# Patient Record
Sex: Female | Born: 1970 | Race: Black or African American | Hispanic: No | Marital: Single | Smoking: Current every day smoker
Health system: Southern US, Community
[De-identification: ages and names within clinical notes are randomized; demographics above are authoritative.]

## PROBLEM LIST (undated history)

## (undated) DIAGNOSIS — K219 Gastro-esophageal reflux disease without esophagitis: Secondary | ICD-10-CM

## (undated) HISTORY — PX: TUBAL LIGATION: SHX77

## (undated) HISTORY — PX: HIP SURGERY: SHX245

---

## 2006-02-16 ENCOUNTER — Emergency Department: Payer: Self-pay | Admitting: Internal Medicine

## 2007-01-19 ENCOUNTER — Emergency Department: Payer: Self-pay

## 2009-01-29 ENCOUNTER — Emergency Department: Payer: Self-pay | Admitting: Emergency Medicine

## 2010-03-05 ENCOUNTER — Emergency Department: Payer: Self-pay | Admitting: Unknown Physician Specialty

## 2010-09-18 IMAGING — CR RIGHT ANKLE - 2 VIEW
1 series · 2 of 2 positions shown · non-contrast
Comparison: none

REASON FOR EXAM: pain swelling
COMMENTS:

PROCEDURE:     DXR - DXR ANKLE RIGHT AP AND LATERAL  - March 05, 2010  [DATE]
RESULT:     No fracture, dislocation or other acute bony abnormality is
identified. The ankle mortise is well maintained. Incidental note is made of
plantar and Achilles calcaneal spurs.

[Series 1: view not recorded · 0.17mm/px · 2 of 2 slices shown]
[im 1/2]
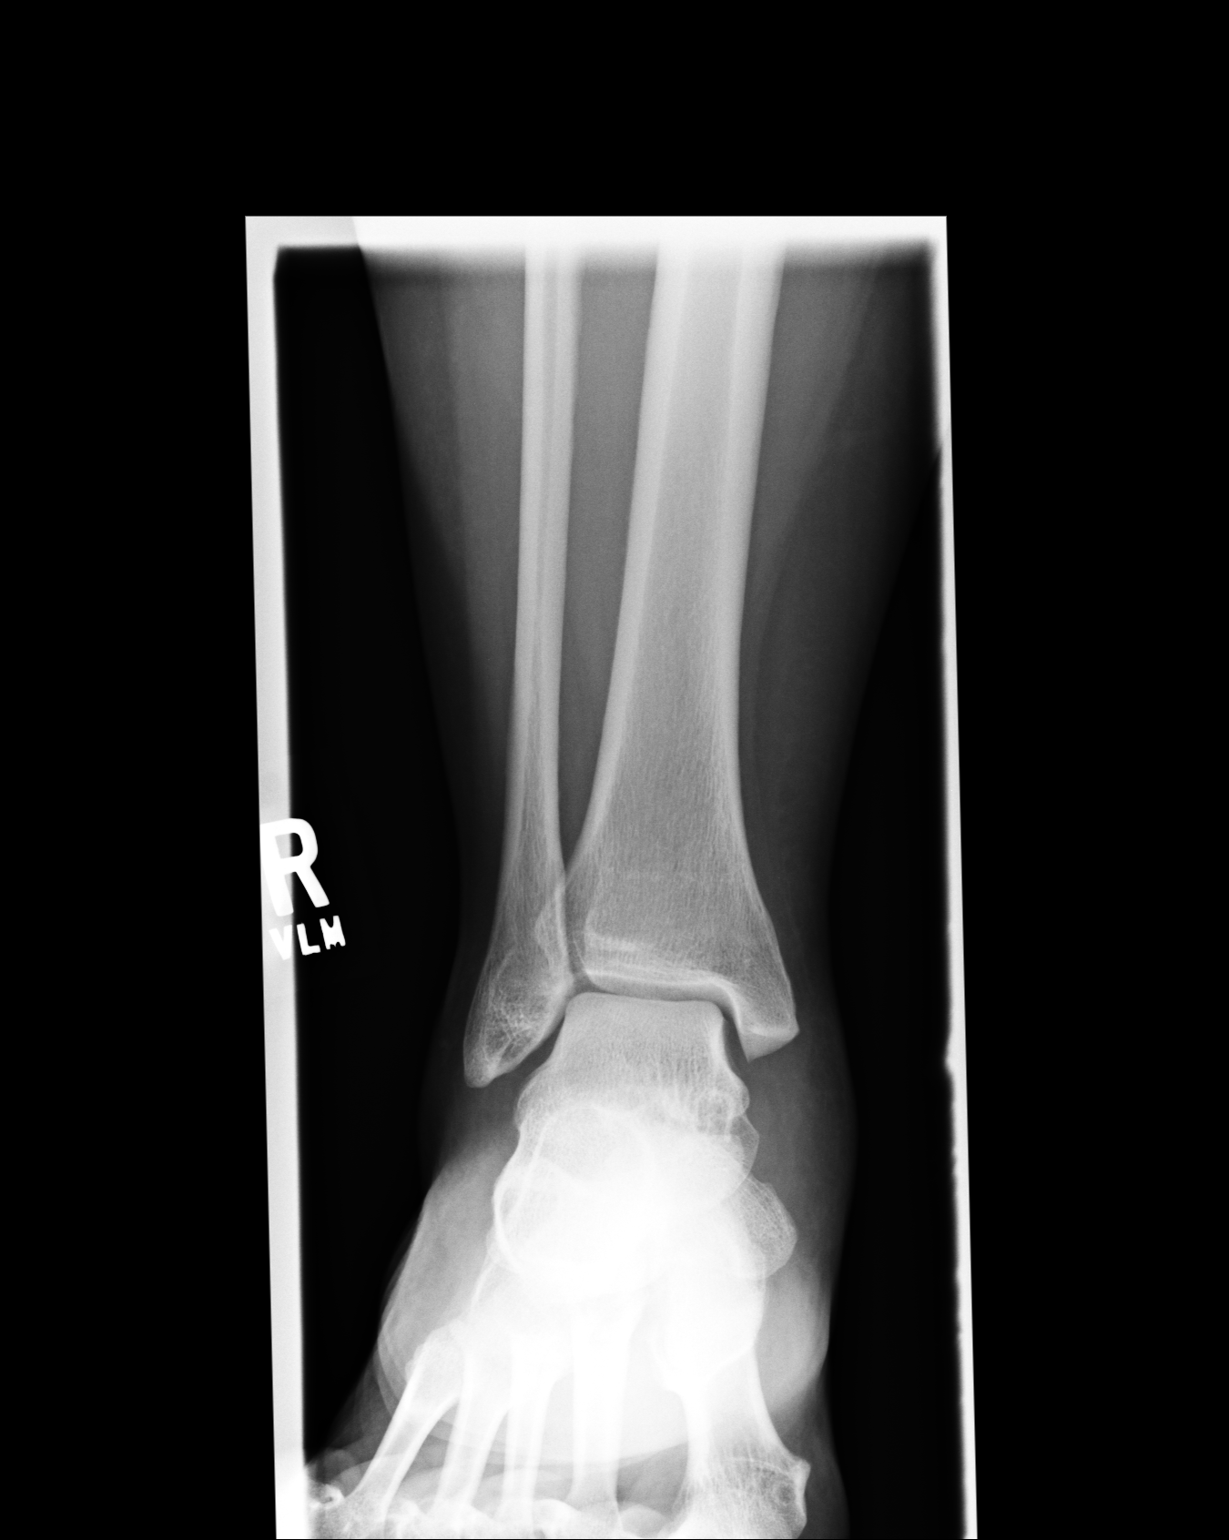
[im 2/2]
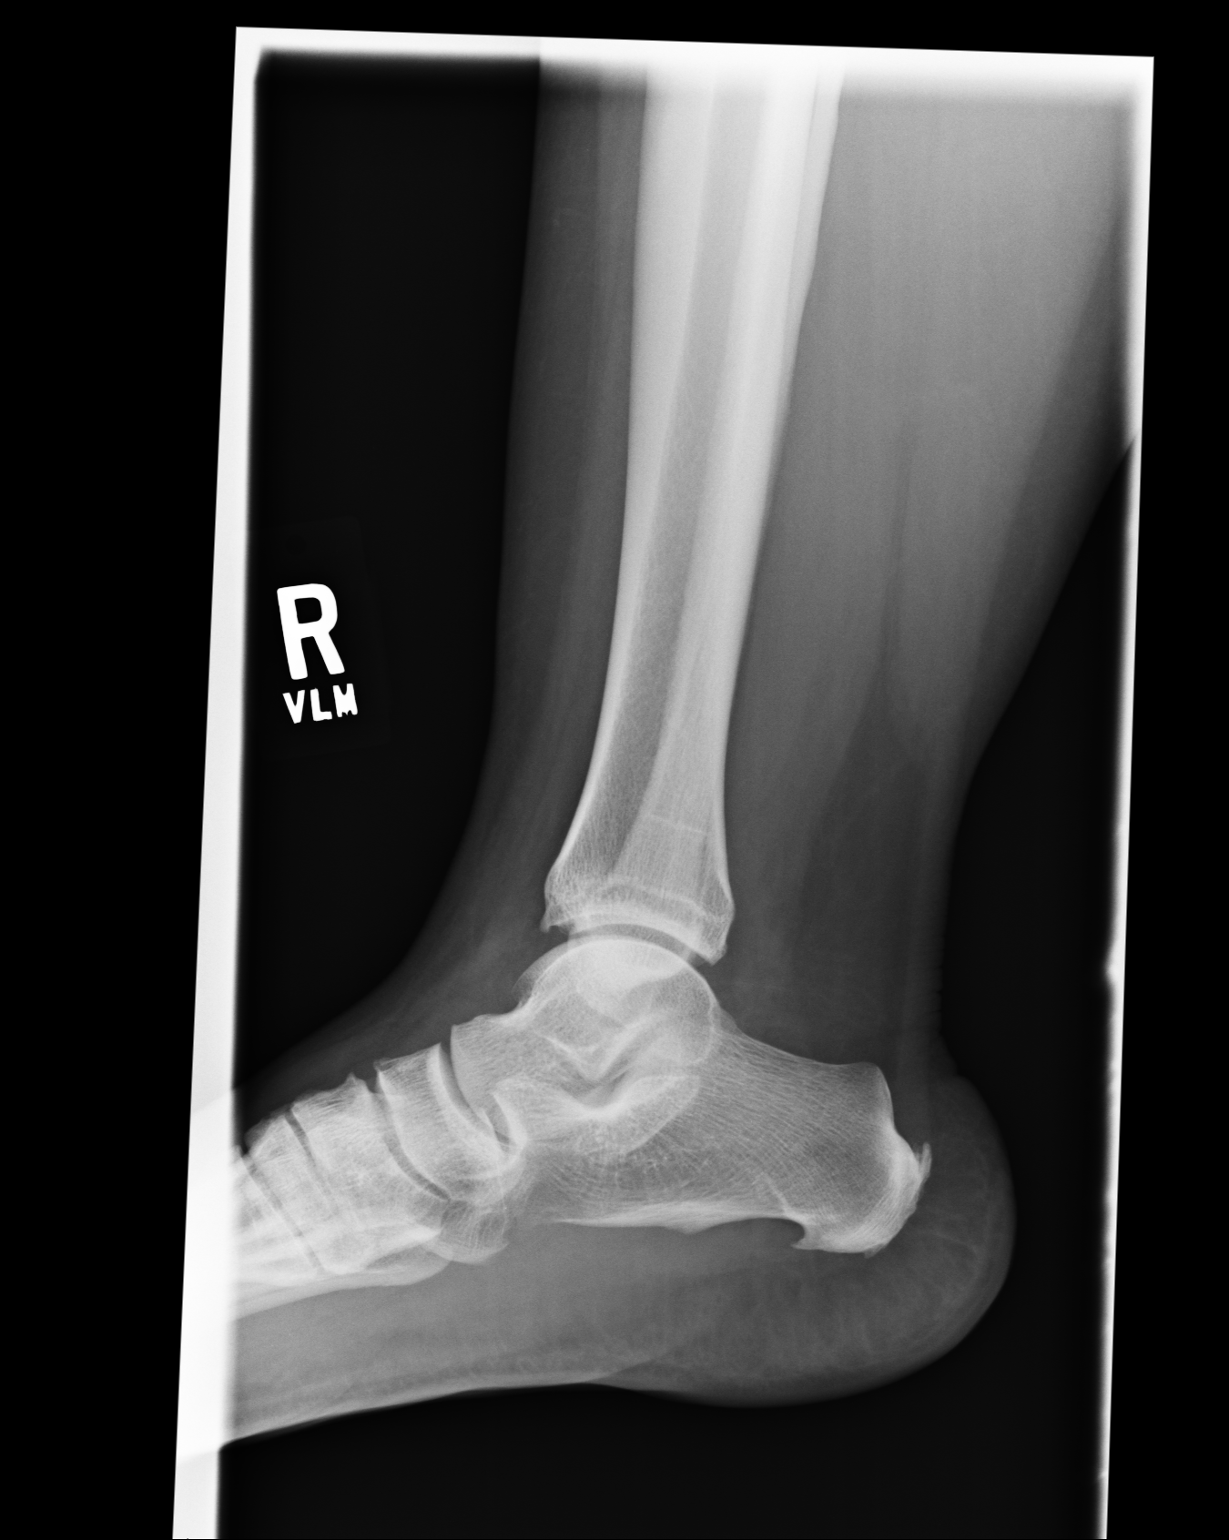

[2 of 2 positions shown; findings below may reference images not displayed]

IMPRESSION: 1. No acute bony abnormalities are seen.
2. Plantar and Achilles calcaneal spurs are noted.

## 2016-03-20 ENCOUNTER — Encounter: Payer: Self-pay | Admitting: Emergency Medicine

## 2016-03-20 ENCOUNTER — Emergency Department
Admission: EM | Admit: 2016-03-20 | Discharge: 2016-03-20 | Disposition: A | Discharge status: Home / Self Care | Payer: Managed Care, Other (non HMO) | Attending: Emergency Medicine | Admitting: Emergency Medicine

## 2016-03-20 DIAGNOSIS — F17210 Nicotine dependence, cigarettes, uncomplicated: Secondary | ICD-10-CM | POA: Diagnosis not present

## 2016-03-20 DIAGNOSIS — M25551 Pain in right hip: Secondary | ICD-10-CM | POA: Diagnosis present

## 2016-03-20 DIAGNOSIS — M7071 Other bursitis of hip, right hip: Secondary | ICD-10-CM

## 2016-03-20 DIAGNOSIS — Y939 Activity, unspecified: Secondary | ICD-10-CM | POA: Diagnosis not present

## 2016-03-20 HISTORY — DX: Gastro-esophageal reflux disease without esophagitis: K21.9

## 2016-03-20 MED ORDER — NAPROXEN 500 MG PO TABS: 500.0000 mg | ORAL_TABLET | Freq: Two times a day (BID) | ORAL | Status: DC

## 2016-03-20 NOTE — Discharge Instructions (Signed)
Heat Therapy Heat therapy can help ease sore, stiff, injured, and tight muscles and joints. Heat relaxes your muscles, which may help ease your pain. Heat therapy should only be used on old, pre-existing, or long-lasting (chronic) injuries. Do not use heat therapy unless told by your doctor. HOW TO USE HEAT THERAPY There are several different kinds of heat therapy, including:  Moist heat pack.  Warm water bath.  Hot water bottle.  Electric heating pad.  Heated gel pack.  Heated wrap.  Electric heating pad. GENERAL HEAT THERAPY RECOMMENDATIONS   Do not sleep while using heat therapy. Only use heat therapy while you are awake.  Your skin may turn pink while using heat therapy. Do not use heat therapy if your skin turns red.  Do not use heat therapy if you have new pain.  High heat or long exposure to heat can cause burns. Be careful when using heat therapy to avoid burning your skin.  Do not use heat therapy on areas of your skin that are already irritated, such as with a rash or sunburn. GET HELP IF:   You have blisters, redness, swelling (puffiness), or numbness.  You have new pain.  Your pain is worse. MAKE SURE YOU:  Understand these instructions.  Will watch your condition.  Will get help right away if you are not doing well or get worse.   This information is not intended to replace advice given to you by your health care provider. Make sure you discuss any questions you have with your health care provider.   Document Released: 02/06/2012 Document Revised: 12/05/2014 Document Reviewed: 01/07/2014 Elsevier Interactive Patient Education 2016 Elsevier Inc.  Hip Bursitis Bursitis is a puffiness (swelling) and soreness of a fluid-filled sac (bursa). This sac covers and protects the joint. HOME CARE  Put ice on the injured area.  Put ice in a plastic bag.  Place a towel between your skin and the bag.  Leave the ice on for 15-20 minutes, 03-04 times a  day.  Rest the painful joint as much as possible. Move your joint at least 4 times a day. When pain lessens, start normal, slow movements and normal activities.  Only take medicine as told by your doctor.  Use crutches as told.  Raise (elevate) your painful joint. Use pillows for propping your legs and hips.  Get a massage to lessen pain. GET HELP RIGHT AWAY IF:  Your pain increases or does not improve during treatment.  You have a fever.  You feel heat coming from the affected area.  You see redness and puffiness around the affected area.  You have any questions or concerns. MAKE SURE YOU:  Understand these instructions.  Will watch your condition.  Will get help right away if you are not well or get worse.   This information is not intended to replace advice given to you by your health care provider. Make sure you discuss any questions you have with your health care provider.   Document Released: 12/17/2010 Document Revised: 02/06/2012 Document Reviewed: 06/16/2015 Elsevier Interactive Patient Education 2016 Elsevier Inc.   Cryotherapy    Cryotherapy is when you put ice on your injury. Ice helps lessen pain and puffiness (swelling) after an injury. Ice works the best when you start using it in the first 24 to 48 hours after an injury.  HOME CARE  Put a dry or damp towel between the ice pack and your skin.  You may press gently on the ice pack.  Leave the ice on for no more than 10 to 20 minutes at a time.  Check your skin after 5 minutes to make sure your skin is okay.  Rest at least 20 minutes between ice pack uses.  Stop using ice when your skin loses feeling (numbness).  Do not use ice on someone who cannot tell you when it hurts. This includes small children and people with memory problems (dementia). GET HELP RIGHT AWAY IF:  You have white spots on your skin.  Your skin turns blue or pale.  Your skin feels waxy or hard.  Your puffiness gets worse. MAKE  SURE YOU:  Understand these instructions.  Will watch your condition.  Will get help right away if you are not doing well or get worse. This information is not intended to replace advice given to you by your health care provider. Make sure you discuss any questions you have with your health care provider.  Document Released: 05/02/2008 Document Revised: 02/06/2012 Document Reviewed: 07/07/2011  Elsevier Interactive Patient Education [employer] Elsevier Inc.

## 2016-03-20 NOTE — ED Provider Notes (Signed)
[facility] Emergency Department Provider Note  ____________________________________________  Time seen: Approximately 8:54 AM  I have reviewed the triage vital signs and the nursing notes.   HISTORY  Chief Complaint Hip Pain    HPI Emily Cantrell is a 45 y.o. female , NAD, presents to the emergency department with 2 day history of lateral right hip pain. States she was [place] local [place] on [place] evening and noticed hip pain on her way home. Denies any falls, injuries or traumas to the area. Does state that she walked around the fair with her godson throughout the evening. Did ride a ride in which moved in circles but no traumatic movement on the right. Doesn't believe it was more walking than she does regularly. Has a history of left hip fracture when she was 45 years old but no issues with her right. Pain increases when she goes from a sitting to standing position. Has not noted any bruising, redness, swelling, warmth to the area. No skin sores or open wounds. Denies any numbness, weakness, tingling. No changes in urinary or bowel habits. No fever, chills, body aches. No back pain. Did take 1 dose of Advil prior to going to bed last night and is uncertain of efficacy.   Past Medical History  Diagnosis Date  . GERD (gastroesophageal reflux disease)     There are no active problems to display for this patient.   Past Surgical History  Procedure Laterality Date  . Tubal ligation    . Hip surgery      left    Current Outpatient Rx  Name  Route  Sig  Dispense  Refill  . naproxen (NAPROSYN) 500 MG tablet   Oral   Take 1 tablet (500 mg total) by mouth 2 (two) times daily with a meal.   14 tablet   0     Allergies Review of patient's allergies indicates no known allergies.  History reviewed. No pertinent family history.  Social History Social History  Substance Use Topics  . Smoking status: Current Every Day Smoker -- 0.25 packs/day    Types:  Cigarettes  . Smokeless tobacco: None  . Alcohol Use: Yes     Review of Systems  Constitutional: No fever/chills, fatigue Cardiovascular: No chest pain, palpitations. Respiratory: No cough. No shortness of breath. No wheezing.  Gastrointestinal: No abdominal pain.  No nausea, vomiting.  No diarrhea.  No constipation. Genitourinary: Negative for dysuria, hematuria. No urinary hesitancy, urgency or increased frequency. Musculoskeletal: Positive right hip pain. Negative for back pain.  Skin: Negative for rash him a bruising, skin source. Neurological: Negative for headaches, focal weakness or numbness. No saddle paresthesias. No tingling. 10-point ROS otherwise negative.  ____________________________________________   PHYSICAL EXAM:  VITAL SIGNS: ED Triage Vitals  Enc Vitals Group     BP 03/20/16 0829 123/85 mmHg     Pulse Rate 03/20/16 0829 72     Resp 03/20/16 0829 18     Temp 03/20/16 0829 98 F (36.7 C)     Temp Source 03/20/16 0829 Oral     SpO2 03/20/16 0829 100 %     Weight 03/20/16 0829 226 lb (102.513 kg)     Height 03/20/16 0829  (1.626 m)     Head Cir --      Peak Flow --      Pain Score 03/20/16 0829 7     Pain Loc --      Pain Edu? --  Excl. in GC? --      Constitutional: Alert and oriented. Well appearing and in no acute distress. Eyes: Conjunctivae are normal. Head: Atraumatic. Neck: Supple with full range of motion. Hematological/Lymphatic/Immunilogical: No cervical lymphadenopathy. Cardiovascular:   Good peripheral circulation with 2+ pulses noted in bilateral lower extremities. Respiratory: Normal respiratory effort without tachypnea or retractions.  Gastrointestinal: Soft and nontender. No distention. No CVA tenderness. Musculoskeletal: Tenderness to palpation about the right greater trochanter. Full range of motion of the right hip and lower extremity. No lower extremity tenderness nor edema.  No joint effusions. Neurologic:  Normal speech  and language. No gross focal neurologic deficits are appreciated.  Skin:  Skin is warm, dry and intact. No rash, redness, warmth, swelling noted about the right hip. Psychiatric: Mood and affect are normal. Speech and behavior are normal. Patient exhibits appropriate insight and judgement.   ____________________________________________   LABS  None ____________________________________________  EKG  None ____________________________________________  RADIOLOGY  None ____________________________________________    PROCEDURES  Procedure(s) performed: None      Medications - No data to display   ____________________________________________   INITIAL IMPRESSION / ASSESSMENT AND PLAN / ED COURSE  Patient's diagnosis is consistent with right hip bursitis. Patient will be discharged home with prescriptions for naproxen to take as directed. May apply alternating heat and ice to the right hip for symptomatic control. Advised the patient follow with her primary care provider in 2-3 days if not improving. Patient is given ED precautions to return to the ED for any worsening or new symptoms.      ____________________________________________  FINAL CLINICAL IMPRESSION(S) / ED DIAGNOSES  Final diagnoses:  Hip bursitis, right      NEW MEDICATIONS STARTED DURING THIS VISIT:  Discharge Medication List as of 03/20/2016  8:56 AM    START taking these medications   Details  naproxen (NAPROSYN) 500 MG tablet Take 1 tablet (500 mg total) by mouth 2 (two) times daily with a meal., Starting 03/20/2016, Until Discontinued, Print             Hope PigeonJami L Delorean Knutzen, PA-C 03/20/16 [id number]  Myrna Blazeravid Matthew Schaevitz, MD 03/20/16 1623
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
## 2016-03-20 NOTE — ED Notes (Signed)
NAD noted at time of D/C. Pt denies questions or concerns. Pt ambulatory to the lobby at this time.  

## 2019-04-18 ENCOUNTER — Other Ambulatory Visit: Payer: Self-pay

## 2019-04-18 ENCOUNTER — Encounter: Payer: Self-pay | Admitting: Emergency Medicine

## 2019-04-18 DIAGNOSIS — M25552 Pain in left hip: Secondary | ICD-10-CM | POA: Diagnosis not present

## 2019-04-18 DIAGNOSIS — F17210 Nicotine dependence, cigarettes, uncomplicated: Secondary | ICD-10-CM | POA: Diagnosis not present

## 2019-04-18 DIAGNOSIS — M545 Low back pain: Secondary | ICD-10-CM | POA: Diagnosis not present
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
## 2019-04-19 ENCOUNTER — Emergency Department
Admission: EM | Admit: 2019-04-19 | Discharge: 2019-04-19 | Disposition: A | Discharge status: Home / Self Care | Payer: No Typology Code available for payment source | Attending: Emergency Medicine | Admitting: Emergency Medicine

## 2019-04-19 ENCOUNTER — Emergency Department: Payer: No Typology Code available for payment source

## 2019-04-19 DIAGNOSIS — M7918 Myalgia, other site: Secondary | ICD-10-CM

## 2019-04-19 MED ORDER — IBUPROFEN 600 MG PO TABS
600.0000 mg | ORAL_TABLET | Freq: Once | ORAL | Status: AC
  Administered 2019-04-19: 600 mg via ORAL
  Filled 2019-04-19: qty 1

## 2019-04-19 NOTE — Discharge Instructions (Addendum)
Return to the emergency room for any new or worrisome symptoms including increased pain or abdominal pain, severe headaches, numbness or weakness or other issues as discussed.  I recommend taking ibuprofen as directed and tolerated for the discomfort.  You should anticipate a couple days of muscle pain from this.  If you have other concerns, please follow-up with [place] I am also giving you follow-up with orthopedic surgery, as a precaution although if your symptoms resolve you may not need to follow-up with them.
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
## 2022-11-10 ENCOUNTER — Other Ambulatory Visit: Payer: Self-pay

## 2022-11-10 ENCOUNTER — Emergency Department
Admission: EM | Admit: 2022-11-10 | Discharge: 2022-11-10 | Disposition: A | Discharge status: Home / Self Care | Payer: Managed Care, Other (non HMO) | Attending: Emergency Medicine | Admitting: Emergency Medicine

## 2022-11-10 ENCOUNTER — Emergency Department: Payer: Managed Care, Other (non HMO)

## 2022-11-10 DIAGNOSIS — M25512 Pain in left shoulder: Secondary | ICD-10-CM | POA: Diagnosis present

## 2022-11-10 DIAGNOSIS — M6520 Calcific tendinitis, unspecified site: Secondary | ICD-10-CM

## 2022-11-10 DIAGNOSIS — M65222 Calcific tendinitis, left upper arm: Secondary | ICD-10-CM | POA: Insufficient documentation

## 2022-11-10 MED ORDER — KETOROLAC TROMETHAMINE 15 MG/ML IJ SOLN
15.0000 mg | Freq: Once | INTRAMUSCULAR | Status: AC
  Administered 2022-11-10: 15 mg via INTRAMUSCULAR
  Filled 2022-11-10: qty 1

## 2022-11-10 MED ORDER — NAPROXEN 500 MG PO TABS: 500.0000 mg | ORAL_TABLET | Freq: Two times a day (BID) | ORAL | 0 refills | Status: AC

## 2022-11-10 NOTE — Discharge Instructions (Signed)
Please follow-up with orthopedic surgery, call today to make an appointment.  You may continue to take Tylenol in addition to the medication that I have prescribed to help with your symptoms.  Please return for any new, worsening, or change in symptoms or other concerns.  It was a pleasure caring for you today.

## 2022-11-10 NOTE — ED Triage Notes (Signed)
Pt reports having pain in her left shoulder that started yesterday but now she is unable to move her arm up and down. Pt can still move her hand/fingers and denys any numbness or tingling. Pt reports hx or arthritis.

## 2022-11-10 NOTE — ED Provider Notes (Signed)
[facility] Provider Note    Event Date/Time   First MD Initiated Contact with Patient 11/10/22 1054     (approximate)   History   Shoulder Pain   HPI  Emily Cantrell is a 51 y.o. female who presents today for evaluation of left shoulder pain that began yesterday.  She reports that she has limited range of motion of her arm secondary to pain.  She denies acute injury.  She denies numbness or tingling.  She has not had any neck pain.  No fevers or chills.  Denies any skin changes.  There are no problems to display for this patient.         Physical Exam   Triage Vital Signs: ED Triage Vitals  Enc Vitals Group     BP 11/10/22 1025 (!) 180/91     Pulse Rate 11/10/22 1025 79     Resp 11/10/22 1025 18     Temp 11/10/22 1025 98.3 F (36.8 C)     Temp Source 11/10/22 1025 Oral     SpO2 11/10/22 1025 100 %     Weight --      Height 11/10/22 1026 5\' 4"  (1.626 m)     Head Circumference --      Peak Flow --      Pain Score 11/10/22 1025 10     Pain Loc --      Pain Edu? --      Excl. in GC? --     Most recent vital signs: Vitals:   11/10/22 1025  BP: (!) 180/91  Pulse: 79  Resp: 18  Temp: 98.3 F (36.8 C)  SpO2: 100%    Physical Exam Vitals and nursing note reviewed.  Constitutional:      General: Awake and alert. No acute distress.    Appearance: Normal appearance. The patient is normal weight.  HENT:     Head: Normocephalic and atraumatic.     Mouth: Mucous membranes are moist.  Eyes:     General: PERRL. Normal EOMs        Right eye: No discharge.        Left eye: No discharge.     Conjunctiva/sclera: Conjunctivae normal.  Cardiovascular:     Rate and Rhythm: Normal rate and regular rhythm.     Pulses: Normal pulses.     Heart sounds: Normal heart sounds Pulmonary:     Effort: Pulmonary effort is normal. No respiratory distress.     Breath sounds: Normal breath sounds.  Abdominal:     Abdomen is soft. There is no abdominal  tenderness. No rebound or guarding. No distention. Musculoskeletal:        General: No swelling. Normal range of motion.     Cervical back: Normal range of motion and neck supple.  Left arm: No obvious deformity, swelling, ecchymosis, or erythema No clavicular or AC joint tenderness Unable to actively forward flex and abduct past 30 degrees secondary to pain, negative drop arm test Pain with any manipulation of the shoulder, tenderness anteriorly. Normal ROM at elbow and wrist Normal resisted pronation and supination 2+ radial pulse Normal grip strength Normal intrinsic hand muscle function No midline cervical spine tenderness.  Full range of motion of neck.  Negative Spurling test.  Negative Lhermitte sign.  Normal strength and sensation in bilateral upper extremities Limited range of motion in left upper extremity secondary to pain. Normal grip strength bilaterally.  Normal intrinsic muscle function of the hand bilaterally.  Normal radial pulses bilaterally. Skin:    General: Skin is warm and dry.     Capillary Refill: Capillary refill takes less than 2 seconds.     Findings: No rash.  Neurological:     Mental Status: The patient is awake and alert.      ED Results / Procedures / Treatments   Labs (all labs ordered are listed, but only abnormal results are displayed) Labs Reviewed - No data to display   EKG     RADIOLOGY I independently reviewed and interpreted imaging and agree with radiologists findings.     PROCEDURES:  Critical Care performed:   Procedures   MEDICATIONS ORDERED IN ED: Medications  ketorolac (TORADOL) 15 MG/ML injection 15 mg (15 mg Intramuscular Given 11/10/22 1134)     IMPRESSION / MDM / ASSESSMENT AND PLAN / ED COURSE  I reviewed the triage vital signs and the nursing notes.   Differential diagnosis includes, but is not limited to, rotator cuff injury, calcific tendinitis, muscle strain, fracture, dislocation.  Patient is awake and  alert, hemodynamically stable and neurologically and neurovascularly intact.  No cervical spine tenderness, normal sensation and normal grip strength bilaterally, negative Spurling and Lhermitte sign, do not suspect cervical spine etiology.  She has pain with any attempted active and passive range of motion of her left shoulder.  X-ray obtained is significant for calcific tendinitis.  She was given Toradol to help with her symptoms, as well as a sling.  However we discussed the risk of adhesive capsulitis with the sling, and I recommended that she take her arm out of the sling 4-5 times per day to perform gentle range of motion exercises which was demonstrated to her.  We discussed follow-up with orthopedics and the appropriate information was provided.  She was given a work note as requested.  We discussed return precautions and outpatient follow-up.  Patient understands and agrees with plan.  Discharged in stable condition.   Patient's presentation is most consistent with acute complicated illness / injury requiring diagnostic workup.   FINAL CLINICAL IMPRESSION(S) / ED DIAGNOSES   Final diagnoses:  Calcific tendonitis     Rx / DC Orders   ED Discharge Orders          Ordered    naproxen (NAPROSYN) 500 MG tablet  2 times daily with meals        11/10/22 1123             Note:  This document was prepared using Dragon voice recognition software and may include unintentional dictation errors.   Marquette Old, PA-C 11/10/22 1323    Blake Divine, MD 11/10/22 [phone]
